# Patient Record
Sex: Female | Born: 1965 | Race: Black or African American | Hispanic: No | Marital: Single | Smoking: Never smoker
Health system: Southern US, Community
[De-identification: ages and names within clinical notes are randomized; demographics above are authoritative.]

---

## 2001-03-15 ENCOUNTER — Encounter: Payer: Self-pay | Admitting: *Deleted

## 2001-03-15 ENCOUNTER — Ambulatory Visit (HOSPITAL_COMMUNITY): Admission: RE | Admit: 2001-03-15 | Discharge: 2001-03-15 | Payer: Self-pay | Admitting: *Deleted

## 2001-11-22 ENCOUNTER — Encounter: Payer: Self-pay | Admitting: *Deleted

## 2001-11-22 ENCOUNTER — Encounter: Admission: RE | Admit: 2001-11-22 | Discharge: 2001-11-22 | Payer: Self-pay | Admitting: *Deleted

## 2002-03-03 ENCOUNTER — Other Ambulatory Visit: Admission: RE | Admit: 2002-03-03 | Discharge: 2002-03-03 | Payer: Self-pay | Admitting: Obstetrics and Gynecology

## 2002-03-11 ENCOUNTER — Encounter (INDEPENDENT_AMBULATORY_CARE_PROVIDER_SITE_OTHER): Payer: Self-pay | Admitting: *Deleted

## 2002-03-11 ENCOUNTER — Observation Stay (HOSPITAL_COMMUNITY): Admission: RE | Admit: 2002-03-11 | Discharge: 2002-03-12 | Payer: Self-pay | Admitting: Obstetrics and Gynecology

## 2002-11-07 ENCOUNTER — Encounter: Payer: Self-pay | Admitting: Family Medicine

## 2002-11-07 ENCOUNTER — Ambulatory Visit (HOSPITAL_COMMUNITY): Admission: RE | Admit: 2002-11-07 | Discharge: 2002-11-07 | Payer: Self-pay | Admitting: Family Medicine

## 2003-01-15 ENCOUNTER — Other Ambulatory Visit: Admission: RE | Admit: 2003-01-15 | Discharge: 2003-01-15 | Payer: Self-pay | Admitting: Obstetrics and Gynecology

## 2003-04-18 ENCOUNTER — Emergency Department (HOSPITAL_COMMUNITY): Admission: EM | Admit: 2003-04-18 | Discharge: 2003-04-18 | Payer: Self-pay | Admitting: *Deleted

## 2005-08-23 ENCOUNTER — Other Ambulatory Visit: Admission: RE | Admit: 2005-08-23 | Discharge: 2005-08-23 | Payer: Self-pay | Admitting: Obstetrics and Gynecology

## 2005-09-15 ENCOUNTER — Encounter: Admission: RE | Admit: 2005-09-15 | Discharge: 2005-09-15 | Payer: Self-pay | Admitting: Obstetrics and Gynecology

## 2006-08-03 ENCOUNTER — Other Ambulatory Visit: Admission: RE | Admit: 2006-08-03 | Discharge: 2006-08-03 | Payer: Self-pay | Admitting: Obstetrics and Gynecology

## 2006-12-28 ENCOUNTER — Encounter: Admission: RE | Admit: 2006-12-28 | Discharge: 2006-12-28 | Payer: Self-pay | Admitting: Family Medicine

## 2007-05-17 ENCOUNTER — Other Ambulatory Visit: Admission: RE | Admit: 2007-05-17 | Discharge: 2007-05-17 | Payer: Self-pay | Admitting: Obstetrics and Gynecology

## 2007-11-13 ENCOUNTER — Other Ambulatory Visit: Admission: RE | Admit: 2007-11-13 | Discharge: 2007-11-13 | Payer: Self-pay | Admitting: Obstetrics and Gynecology

## 2008-02-07 ENCOUNTER — Encounter: Admission: RE | Admit: 2008-02-07 | Discharge: 2008-02-07 | Payer: Self-pay | Admitting: Family Medicine

## 2009-08-25 ENCOUNTER — Other Ambulatory Visit: Admission: RE | Admit: 2009-08-25 | Discharge: 2009-08-25 | Payer: Self-pay | Admitting: Obstetrics and Gynecology

## 2010-02-14 ENCOUNTER — Encounter
Admission: RE | Admit: 2010-02-14 | Discharge: 2010-02-14 | Payer: Self-pay | Source: Home / Self Care | Attending: Family Medicine | Admitting: Family Medicine

## 2010-07-22 NOTE — H&P (Signed)
NAME:  [name], [name]                        ACCOUNT NO.:  [id number]   MEDICAL RECORD NO.:  [id number]                   PATIENT TYPE:  AMB   LOCATION:  SDC                                  FACILITY:  [facility]   PHYSICIAN:  Janine Limbo, M.D.            DATE OF BIRTH:  1966/02/13   DATE OF ADMISSION:  DATE OF DISCHARGE:                                HISTORY & PHYSICAL   HISTORY OF PRESENT ILLNESS:  The patient is a 45 year old female, para 1-0-0-  1, who presents for vaginal hysterectomy because of dysmenorrhea,  menorrhagia, dyspareunia, and a fibroid uterus.  An ultrasound showed a 9.9  x 7.9 cm uterus with multiple fibroids.  The largest fibroid measured 6.3 x  6.1 cm.  Nonsteroidal antiinflammatory agents have not relieved her  discomfort.  Birth control pills have not been able to relieve her  discomfort.  She is ready to proceed with definitive therapy.  Her most  recent pap smear was in 12/03 and it was within normal limits.  Her  endometrial biopsy was within normal limits.   PAST MEDICAL HISTORY:  1. The patient denies hypertension and diabetes.  2. She had her wisdom teeth removed in 2000.   ALLERGIES:  None known.   SOCIAL HISTORY:  The patient drinks alcohol socially.  She denies cigarette  use and recreational drug use.   OBSTETRICAL HISTORY:  The patient has had one term vaginal delivery.   REVIEW OF SYSTEMS:  Please see history of present illness.   FAMILY HISTORY:  The patient's father has hypertension.  The patient's  grandfather had colon cancer.   PHYSICAL EXAMINATION:  VITAL SIGNS: Weight is 130 pounds.  HEENT: Within normal limits.  CHEST: Clear.  HEART: Regular rate and rhythm.  BREASTS: Without masses.  ABDOMEN: Nontender.  EXTREMITIES: Within normal limits.  NEUROLOGIC: Grossly normal.  PELVIC: External genitalia normal.  The vagina is normal.  The cervix is  nontender.  The uterus is 10-12 weeks size, posterior, irregular, and firm.  Adnexa no masses.  Rectal vaginal examination confirms.   ASSESSMENT:  1. Fibroid uterus.  2. Dysmenorrhea.  3. Menorrhagia.  4. Dyspareunia.    PLAN:  The patient will undergo a vaginal hysterectomy.  She understands the  indications for her procedure and she accepts the risks of, but not limited  to, anesthetic complications, bleeding, infection, and possible damage to  the surrounding organs.                                               Janine Limbo, M.D.    AVS/MEDQ  D:  03/03/2002  T:  03/03/2002  Job:  [id number]   cc:   Tama Headings. Marina Goodell, M.D.  [address]  [place]  [place] [address]  Fax: [phone]

## 2010-07-22 NOTE — Op Note (Signed)
NAME:  [name], [name]                        ACCOUNT NO.:  [id number]   MEDICAL RECORD NO.:  [id number]                   PATIENT TYPE:  OBV   LOCATION:  9399                                 [facility]ACILITY:  [facility]   PHYSICIAN:  Janine Limbo, M.D.            DATE OF BIRTH:  13-Aug-1965   DATE OF PROCEDURE:  03/11/2002  DATE OF DISCHARGE:                                 OPERATIVE REPORT   PREOPERATIVE DIAGNOSES:  1. A 12 week sized fibroid uterus.  2. Menometrorrhagia.   POSTOPERATIVE DIAGNOSES:  1. A 14 week sized fibroid uterus.  2. Menometrorrhagia.   PROCEDURE:  Vaginal hysterectomy.   SURGEON:  Janine Limbo, M.D.   FIRST ASSISTANT:  Osborn Coho, M.D.   ANESTHESIA:  General.   DISPOSITION:  The patient is a 45 year old female para 1-0-0-1 who presents  with the above mentioned diagnoses.  She understands the indications for her  procedure and she accepts the risks of, but not limited to, anesthetic  complications, bleeding, infections, and possible damage to the surrounding  organs.   FINDINGS:  A 14 week sized fibroid uterus was present.  The weight of the  uterus was 350 g.  The fallopian tubes and the ovaries were normal.   PROCEDURE:  The patient was taken to the operating room where a general  anesthetic was given.  The patient's lower abdomen, perineum, and vagina  were prepped with multiple layers of Betadine.  A Foley catheter was placed  in the bladder.  Examination under anesthesia was performed.  The patient  was sterilely draped.  A weighted speculum was placed in the posterior  vagina and the cervix was injected with a diluted solution of Pitressin and  saline.  A circumferential incision was made around the cervix.  The vaginal  mucosa was advanced both anteriorly and posteriorly.  The anterior cul-de-  sac and then the posterior cul-de-sacs were sharply entered.  Alternating  from left to right the uterosacral ligaments, paracervical tissues,  parametrial tissues, uterine arteries, and upper pedicles were clamped, cut,  sutured, and tied securely.  The uterus was inverted through the posterior  colpotomy.  The remainder of the upper pedicles were secured and cut.  The  uterus was removed from the operative field.  The upper pedicles were then  free tied and then suture ligated.  Hemostasis was achieved using figure-of-  eight sutures.  The sutures attached to the uterosacral ligaments were  brought out through the vaginal angles and tied securely.  A McCall  culdoplasty suture was placed in the posterior cul-de-sac, incorporating the  uterosacral ligaments bilaterally and the posterior peritoneum.  A final  check was made for hemostasis and hemostasis was noted to be adequate.  A  purse-string suture was placed in the peritoneum and closed securely.  The  vaginal cuff was closed using a running suture.  The McCall culdoplasty  suture was tied securely and  the apex of the vagina was noted to elevate  into the mid pelvis.  0 Vicryl was the suture material used throughout the  procedure.  Sponge, needle, and instrument counts  were correct on two occasions.  The estimated blood loss was 100 cc.  The  patient was noted to drain clear yellow urine at the end of her procedure.  The patient was awakened from her anesthetic and taken to the recovery room  in stable condition.                                               Janine Limbo, M.D.    AVS/MEDQ  D:  03/11/2002  T:  03/11/2002  Job:  [id number]   cc:   Tama Headings. Marina Goodell, M.D.  [address]  [place]  [place] [address]  Fax: [phone]

## 2010-07-22 NOTE — Discharge Summary (Signed)
   NAME:  [name], [name]                        ACCOUNT NO.:  [id number]   MEDICAL RECORD NO.:  [id number]                   PATIENT TYPE:  OBV   LOCATION:  9308                                 [facility]ILITY:  [facility]   PHYSICIAN:  Janine Limbo, M.D.            DATE OF BIRTH:  06-Dec-1965   DATE OF ADMISSION:  03/11/2002  DATE OF DISCHARGE:  03/12/2002                                 DISCHARGE SUMMARY   DISCHARGE DIAGNOSES:  1. Fibroid uterus.  2. Menometrorrhagia.   PROCEDURE:  March 11, 2002, - Hysterectomy.   HISTORY OF PRESENT ILLNESS:  The patient is a 45 year old female, para 1-0-0-  1, who presents with heavy vaginal bleeding and a known history of fibroids.  She was ready to proceed with definitive therapy.  Please see her dictated  history and physical for details.   ADMISSION PHYSICAL EXAMINATION:  The patient was noted to have a 12-week  size fibroid uterus.   HOSPITAL COURSE:  On the day of admission, the patient underwent a vaginal  hysterectomy.  Operative findings included a 14-week size fibroid uterus.  The fallopian tubes and ovaries were normal.  The patient tolerated the  procedure well.  Her postoperative course was uneventful.  Her postoperative  hemoglobin was 10.2 (preoperative hemoglobin 12.4).  She quickly tolerated a  regular diet.  On postoperative day #1 she was tolerating a regular diet and  was ready for discharge.   DISCHARGE INSTRUCTIONS:  The patient will return to Dr. Stefano Gaul in six  weeks for follow-up examination.  She will refrain from driving for one to  two weeks, heavy lifting for four weeks, and intercourse for six weeks.  She  will call for questions or concerns.   DISCHARGE MEDICATIONS:  1. Vicodin one to two p.o. q.4h. p.r.n. for pain.  2. Iron 325 mg each day.   PATHOLOGY:  The final pathology report is pending.                                               Janine Limbo, M.D.    AVS/MEDQ  D:  03/12/2002  T:  03/12/2002   Job:  [id number]

## 2012-03-19 ENCOUNTER — Other Ambulatory Visit: Payer: Self-pay | Admitting: Family Medicine

## 2012-03-19 DIAGNOSIS — Z1231 Encounter for screening mammogram for malignant neoplasm of breast: Secondary | ICD-10-CM

## 2012-06-25 ENCOUNTER — Ambulatory Visit
Admission: RE | Admit: 2012-06-25 | Discharge: 2012-06-25 | Disposition: A | Discharge status: Home / Self Care | Payer: BC Managed Care – PPO | Source: Ambulatory Visit | Attending: Family Medicine | Admitting: Family Medicine

## 2012-06-25 DIAGNOSIS — Z1231 Encounter for screening mammogram for malignant neoplasm of breast: Secondary | ICD-10-CM

## 2012-07-03 ENCOUNTER — Other Ambulatory Visit: Payer: Self-pay | Admitting: Family Medicine

## 2012-07-03 ENCOUNTER — Ambulatory Visit
Admission: RE | Admit: 2012-07-03 | Discharge: 2012-07-03 | Disposition: A | Discharge status: Home / Self Care | Payer: No Typology Code available for payment source | Source: Ambulatory Visit | Attending: Family Medicine | Admitting: Family Medicine

## 2012-07-03 DIAGNOSIS — M79609 Pain in unspecified limb: Secondary | ICD-10-CM

## 2013-06-02 ENCOUNTER — Other Ambulatory Visit: Payer: Self-pay

## 2013-06-02 DIAGNOSIS — Z1231 Encounter for screening mammogram for malignant neoplasm of breast: Secondary | ICD-10-CM

## 2013-07-16 ENCOUNTER — Ambulatory Visit
Admission: RE | Admit: 2013-07-16 | Discharge: 2013-07-16 | Disposition: A | Discharge status: Home / Self Care | Payer: BC Managed Care – PPO | Source: Ambulatory Visit

## 2013-07-16 ENCOUNTER — Encounter (INDEPENDENT_AMBULATORY_CARE_PROVIDER_SITE_OTHER): Payer: Self-pay

## 2013-07-16 DIAGNOSIS — Z1231 Encounter for screening mammogram for malignant neoplasm of breast: Secondary | ICD-10-CM

## 2014-06-18 ENCOUNTER — Other Ambulatory Visit: Payer: Self-pay

## 2014-06-18 DIAGNOSIS — Z1231 Encounter for screening mammogram for malignant neoplasm of breast: Secondary | ICD-10-CM

## 2014-08-05 ENCOUNTER — Ambulatory Visit
Admission: RE | Admit: 2014-08-05 | Discharge: 2014-08-05 | Disposition: A | Discharge status: Home / Self Care | Payer: BLUE CROSS/BLUE SHIELD | Source: Ambulatory Visit

## 2014-08-05 DIAGNOSIS — Z1231 Encounter for screening mammogram for malignant neoplasm of breast: Secondary | ICD-10-CM

## 2014-11-13 ENCOUNTER — Other Ambulatory Visit: Payer: Self-pay | Admitting: Gastroenterology

## 2015-02-17 ENCOUNTER — Other Ambulatory Visit: Payer: Self-pay | Admitting: Physician Assistant

## 2015-02-17 DIAGNOSIS — R1032 Left lower quadrant pain: Secondary | ICD-10-CM

## 2015-03-03 ENCOUNTER — Ambulatory Visit
Admission: RE | Admit: 2015-03-03 | Discharge: 2015-03-03 | Disposition: A | Discharge status: Home / Self Care | Payer: BLUE CROSS/BLUE SHIELD | Source: Ambulatory Visit | Attending: Physician Assistant | Admitting: Physician Assistant

## 2015-03-03 DIAGNOSIS — R1032 Left lower quadrant pain: Secondary | ICD-10-CM

## 2015-06-30 ENCOUNTER — Other Ambulatory Visit: Payer: Self-pay

## 2015-06-30 DIAGNOSIS — Z1231 Encounter for screening mammogram for malignant neoplasm of breast: Secondary | ICD-10-CM

## 2015-08-12 ENCOUNTER — Ambulatory Visit
Admission: RE | Admit: 2015-08-12 | Discharge: 2015-08-12 | Disposition: A | Discharge status: Home / Self Care | Payer: BLUE CROSS/BLUE SHIELD | Source: Ambulatory Visit

## 2015-08-12 ENCOUNTER — Other Ambulatory Visit: Payer: Self-pay | Admitting: Family Medicine

## 2015-08-12 DIAGNOSIS — Z1231 Encounter for screening mammogram for malignant neoplasm of breast: Secondary | ICD-10-CM

## 2015-08-17 ENCOUNTER — Other Ambulatory Visit: Payer: Self-pay | Admitting: Family Medicine

## 2015-08-17 DIAGNOSIS — R928 Other abnormal and inconclusive findings on diagnostic imaging of breast: Secondary | ICD-10-CM

## 2015-08-24 ENCOUNTER — Ambulatory Visit
Admission: RE | Admit: 2015-08-24 | Discharge: 2015-08-24 | Disposition: A | Discharge status: Home / Self Care | Payer: BLUE CROSS/BLUE SHIELD | Source: Ambulatory Visit | Attending: Family Medicine | Admitting: Family Medicine

## 2015-08-24 DIAGNOSIS — R928 Other abnormal and inconclusive findings on diagnostic imaging of breast: Secondary | ICD-10-CM

## 2016-03-15 ENCOUNTER — Other Ambulatory Visit: Payer: Self-pay | Admitting: Family Medicine

## 2016-03-15 ENCOUNTER — Ambulatory Visit
Admission: RE | Admit: 2016-03-15 | Discharge: 2016-03-15 | Disposition: A | Discharge status: Home / Self Care | Payer: PRIVATE HEALTH INSURANCE | Source: Ambulatory Visit | Attending: Family Medicine | Admitting: Family Medicine

## 2016-03-15 DIAGNOSIS — M25522 Pain in left elbow: Secondary | ICD-10-CM

## 2016-07-14 ENCOUNTER — Other Ambulatory Visit: Payer: Self-pay | Admitting: Family Medicine

## 2016-07-14 DIAGNOSIS — Z1231 Encounter for screening mammogram for malignant neoplasm of breast: Secondary | ICD-10-CM

## 2016-08-25 ENCOUNTER — Ambulatory Visit
Admission: RE | Admit: 2016-08-25 | Discharge: 2016-08-25 | Disposition: A | Discharge status: Home / Self Care | Payer: No Typology Code available for payment source | Source: Ambulatory Visit | Attending: Family Medicine | Admitting: Family Medicine

## 2016-08-25 DIAGNOSIS — Z1231 Encounter for screening mammogram for malignant neoplasm of breast: Secondary | ICD-10-CM

## 2016-11-22 ENCOUNTER — Ambulatory Visit (INDEPENDENT_AMBULATORY_CARE_PROVIDER_SITE_OTHER): Payer: No Typology Code available for payment source

## 2016-11-22 ENCOUNTER — Encounter (INDEPENDENT_AMBULATORY_CARE_PROVIDER_SITE_OTHER): Payer: Self-pay | Admitting: Orthopedic Surgery

## 2016-11-22 ENCOUNTER — Ambulatory Visit (INDEPENDENT_AMBULATORY_CARE_PROVIDER_SITE_OTHER): Payer: No Typology Code available for payment source | Admitting: Orthopedic Surgery

## 2016-11-22 DIAGNOSIS — M25512 Pain in left shoulder: Secondary | ICD-10-CM

## 2016-11-22 DIAGNOSIS — G8929 Other chronic pain: Secondary | ICD-10-CM

## 2016-11-22 NOTE — Progress Notes (Signed)
Office Visit Note   Patient: [name]           Date of Birth: March 20, 1965           MRN: [id number] Visit Date: 11/22/2016 Requested by: Merri Brunette, MD [phone] [place], [place] [address] PCP: Merri Brunette, MD  Subjective: Chief Complaint  Patient presents with  . 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  Denies any history of injury.  Pain is been going on for 6 months.  Radiates to the elbow but not below the elbow.  She denies any neck pain.  She likes to workout about 5 days a week.  It is hard for her to get undressed at this time.  She denies any mechanical symptoms but just reports pain primarily with abduction.  She also has our time reaching across her body to the other shoulder.  She cannot lay on that side.  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  Her workout routine specifically for the shoulder has necessarily been curtailed because of pain.  She does have a remote history of GI bleed taking anti-inflammatories.              ROS: All systems reviewed are negative as they relate to the chief complaint within the history of present illness.  Patient denies  fevers or chills.   Assessment & Plan: Visit Diagnoses:  1. Chronic left shoulder pain     Plan: Impression is atypical left shoulder pain which today looks like may be adhesive capsulitis which the patient has been working through that she has been working out.  Not much in the way of rotator cuff weakness or labral pathology.  Again pain with history of no trauma in a 51 year old female is some restriction of passive range of motion is most likely adhesive capsulitis.  I will try her on to axis only one per day as an anti-inflammatory and because of duration of symptoms and the history and examination which is mostly but not entirely consistent with frozen shoulder I would like to get an MRI arthrogram of the shoulder.   I will see her back after that study  Follow-Up Instructions: Return for after MRI.   Orders:  Orders Placed This Encounter  Procedures  . XR Shoulder Left  . MR Shoulder Left w/ contrast  . Arthrogram   No orders of the defined types were placed in this encounter.     Procedures: No procedures performed   Clinical Data: No additional findings.  Objective: Vital Signs: There were no vitals taken for this visit.  Physical Exam:   Constitutional: Patient appears well-developed HEENT:  Head: Normocephalic Eyes:EOM are normal Neck: Normal range of motion Cardiovascular: Normal rate Pulmonary/chest: Effort normal Neurologic: Patient is alert Skin: Skin is warm Psychiatric: Patient has normal mood and affect    Ortho Exam: Orthopedic exam demonstrates good cervical spine range of motion.  5 out of 5 grip EPL FPL interosseous wrist flexion-extension biceps triceps and deltoid strength.  Radial pulses intact.  She does have a little limitation with crossarm adduction but no acromioclavicular joint tenderness on the left-hand side.  Rotator cuff strength feels intact.  External rotation range of motion is not limited but is painful at the end range.  She also lacks about 10 of full forward flexion of the left compared to the right.  No other  masses lymph adenopathy or skin changes noted in the shoulder girdle region  Specialty Comments:  No specialty comments available.  Imaging: Xr Shoulder Left  Result Date: 11/22/2016 AP lateral elbow and left shoulder reviewed.  Shoulder is located.  Visualized lung fields clear.  Only mild degenerative acromioclavicular joint narrowing.  No acromial spurring.  Acromiohumeral distance maintained.  Normal shoulder    PMFS History: There are no active problems to display for this patient.  No past medical history on file.  No family history on file.  No past surgical history on file. Social History   Occupational History  . Not  on file.   Social History Main Topics  . Smoking status: Never Smoker  . Smokeless tobacco: Never Used  . Alcohol use Not on file  . Drug use: Unknown  . Sexual activity: Not on file

## 2016-12-08 ENCOUNTER — Telehealth (INDEPENDENT_AMBULATORY_CARE_PROVIDER_SITE_OTHER): Payer: Self-pay | Admitting: Orthopedic Surgery

## 2016-12-08 NOTE — Telephone Encounter (Signed)
11/22/2016 OV NOTE FAXED [phone] [facility] @ [facility]

## 2016-12-13 ENCOUNTER — Other Ambulatory Visit: Payer: No Typology Code available for payment source

## 2016-12-20 ENCOUNTER — Ambulatory Visit (INDEPENDENT_AMBULATORY_CARE_PROVIDER_SITE_OTHER): Payer: No Typology Code available for payment source | Admitting: Orthopedic Surgery

## 2017-07-27 ENCOUNTER — Other Ambulatory Visit: Payer: Self-pay | Admitting: Family Medicine

## 2017-07-27 DIAGNOSIS — Z1231 Encounter for screening mammogram for malignant neoplasm of breast: Secondary | ICD-10-CM

## 2017-08-31 ENCOUNTER — Ambulatory Visit
Admission: RE | Admit: 2017-08-31 | Discharge: 2017-08-31 | Disposition: A | Discharge status: Home / Self Care | Payer: PRIVATE HEALTH INSURANCE | Source: Ambulatory Visit | Attending: Family Medicine | Admitting: Family Medicine

## 2017-08-31 DIAGNOSIS — Z1231 Encounter for screening mammogram for malignant neoplasm of breast: Secondary | ICD-10-CM

## 2018-01-02 ENCOUNTER — Other Ambulatory Visit: Payer: Self-pay | Admitting: Family Medicine

## 2018-01-02 ENCOUNTER — Ambulatory Visit
Admission: RE | Admit: 2018-01-02 | Discharge: 2018-01-02 | Disposition: A | Discharge status: Home / Self Care | Payer: PRIVATE HEALTH INSURANCE | Source: Ambulatory Visit | Attending: Family Medicine | Admitting: Family Medicine

## 2018-01-02 DIAGNOSIS — M545 Low back pain, unspecified: Secondary | ICD-10-CM

## 2018-07-23 ENCOUNTER — Other Ambulatory Visit: Payer: Self-pay | Admitting: Family Medicine

## 2018-07-23 DIAGNOSIS — Z1231 Encounter for screening mammogram for malignant neoplasm of breast: Secondary | ICD-10-CM

## 2018-08-02 ENCOUNTER — Other Ambulatory Visit: Payer: Self-pay

## 2018-08-02 ENCOUNTER — Ambulatory Visit
Admission: RE | Admit: 2018-08-02 | Discharge: 2018-08-02 | Disposition: A | Discharge status: Home / Self Care | Payer: PRIVATE HEALTH INSURANCE | Source: Ambulatory Visit | Attending: Family Medicine | Admitting: Family Medicine

## 2018-08-02 ENCOUNTER — Other Ambulatory Visit: Payer: Self-pay | Admitting: Family Medicine

## 2018-08-02 DIAGNOSIS — S6992XD Unspecified injury of left wrist, hand and finger(s), subsequent encounter: Secondary | ICD-10-CM

## 2018-09-11 ENCOUNTER — Other Ambulatory Visit: Payer: Self-pay

## 2018-09-11 ENCOUNTER — Ambulatory Visit
Admission: RE | Admit: 2018-09-11 | Discharge: 2018-09-11 | Disposition: A | Discharge status: Home / Self Care | Payer: PRIVATE HEALTH INSURANCE | Source: Ambulatory Visit | Attending: Family Medicine | Admitting: Family Medicine

## 2018-09-11 DIAGNOSIS — Z1231 Encounter for screening mammogram for malignant neoplasm of breast: Secondary | ICD-10-CM

## 2019-10-17 ENCOUNTER — Other Ambulatory Visit: Payer: Self-pay | Admitting: Family Medicine

## 2019-10-17 DIAGNOSIS — Z1231 Encounter for screening mammogram for malignant neoplasm of breast: Secondary | ICD-10-CM

## 2019-11-05 ENCOUNTER — Other Ambulatory Visit: Payer: Self-pay

## 2019-11-05 ENCOUNTER — Ambulatory Visit
Admission: RE | Admit: 2019-11-05 | Discharge: 2019-11-05 | Disposition: A | Discharge status: Home / Self Care | Payer: PRIVATE HEALTH INSURANCE | Source: Ambulatory Visit | Attending: Family Medicine | Admitting: Family Medicine

## 2019-11-05 DIAGNOSIS — Z1231 Encounter for screening mammogram for malignant neoplasm of breast: Secondary | ICD-10-CM

## 2019-12-16 ENCOUNTER — Other Ambulatory Visit: Payer: Self-pay | Admitting: Family Medicine

## 2019-12-16 DIAGNOSIS — R519 Headache, unspecified: Secondary | ICD-10-CM

## 2019-12-19 ENCOUNTER — Ambulatory Visit
Admission: RE | Admit: 2019-12-19 | Discharge: 2019-12-19 | Disposition: A | Discharge status: Home / Self Care | Payer: PRIVATE HEALTH INSURANCE | Source: Ambulatory Visit | Attending: Family Medicine | Admitting: Family Medicine

## 2019-12-19 ENCOUNTER — Other Ambulatory Visit: Payer: Self-pay

## 2019-12-19 DIAGNOSIS — R519 Headache, unspecified: Secondary | ICD-10-CM

## 2019-12-19 MED ORDER — GADOBENATE DIMEGLUMINE 529 MG/ML IV SOLN
14.0000 mL | Freq: Once | INTRAVENOUS | Status: AC | PRN
Start: 2019-12-19 — End: 2019-12-19
  Administered 2019-12-19: 14 mL via INTRAVENOUS

## 2020-01-06 ENCOUNTER — Other Ambulatory Visit: Payer: PRIVATE HEALTH INSURANCE

## 2020-01-08 ENCOUNTER — Other Ambulatory Visit: Payer: PRIVATE HEALTH INSURANCE

## 2020-09-27 ENCOUNTER — Other Ambulatory Visit: Payer: Self-pay | Admitting: Radiology

## 2020-09-27 ENCOUNTER — Other Ambulatory Visit: Payer: Self-pay | Admitting: Family Medicine

## 2020-09-27 DIAGNOSIS — Z1231 Encounter for screening mammogram for malignant neoplasm of breast: Secondary | ICD-10-CM

## 2020-11-17 ENCOUNTER — Ambulatory Visit
Admission: RE | Admit: 2020-11-17 | Discharge: 2020-11-17 | Disposition: A | Discharge status: Home / Self Care | Payer: No Typology Code available for payment source | Source: Ambulatory Visit | Attending: Family Medicine | Admitting: Family Medicine

## 2020-11-17 ENCOUNTER — Other Ambulatory Visit: Payer: Self-pay

## 2020-11-17 DIAGNOSIS — Z1231 Encounter for screening mammogram for malignant neoplasm of breast: Secondary | ICD-10-CM

## 2021-05-12 ENCOUNTER — Ambulatory Visit
Admission: RE | Admit: 2021-05-12 | Discharge: 2021-05-12 | Disposition: A | Discharge status: Home / Self Care | Payer: No Typology Code available for payment source | Source: Ambulatory Visit | Attending: Family Medicine | Admitting: Family Medicine

## 2021-05-12 ENCOUNTER — Other Ambulatory Visit: Payer: Self-pay | Admitting: Family Medicine

## 2021-05-12 ENCOUNTER — Other Ambulatory Visit: Payer: Self-pay

## 2021-05-12 DIAGNOSIS — M545 Low back pain, unspecified: Secondary | ICD-10-CM

## 2021-05-12 DIAGNOSIS — M546 Pain in thoracic spine: Secondary | ICD-10-CM

## 2021-10-21 ENCOUNTER — Other Ambulatory Visit: Payer: Self-pay | Admitting: Family Medicine

## 2021-10-21 DIAGNOSIS — Z1231 Encounter for screening mammogram for malignant neoplasm of breast: Secondary | ICD-10-CM

## 2021-11-02 IMAGING — MR MR HEAD WO/W CM
12 series · 48 of 48 positions shown · IV contrast (multihance)
Comparison: None.

CLINICAL DATA: Headaches

EXAM:
MRI HEAD WITHOUT AND WITH CONTRAST
TECHNIQUE: Multiplanar, multiecho pulse sequences of the brain and surrounding
structures were obtained without and with intravenous contrast.
CONTRAST:  14mL MULTIHANCE GADOBENATE DIMEGLUMINE 529 MG/ML IV SOLN

[Series 1: t1_se_sag · sagittal · 5.0mm · 0.45mm/px · 1 of 26 slices shown]
[im 1/26]
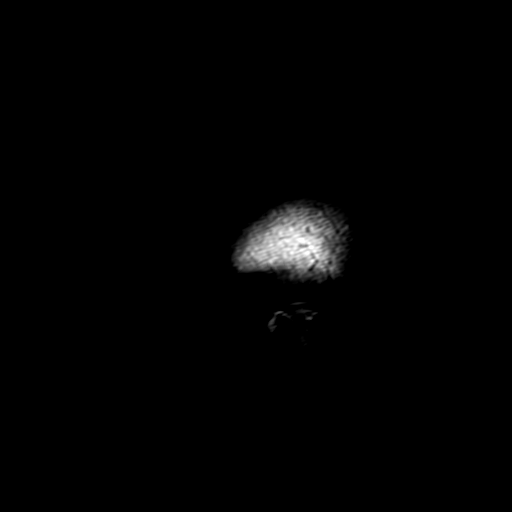

[Series 2: ep2d_diff_3 · axial · 3.0mm · 1.80mm/px · z∈[-67,+80]mm · 6 of 100 slices shown]
[im 1/100]
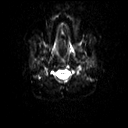
[im 20/100]
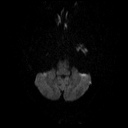
[im 40/100]
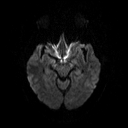
[im 60/100]
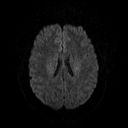
[im 80/100]
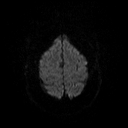
[im 100/100]
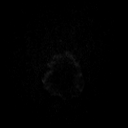

[Series 3: ep2d_diff_3_adc · axial · 3.0mm · 1.80mm/px · z∈[-67,+80]mm · 3 of 50 slices shown]
[im 1/50]
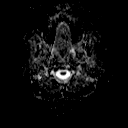
[im 25/50]
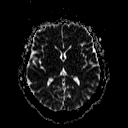
[im 50/50]
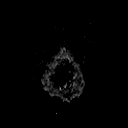

[Series 4: ep2d_diff_cor · coronal · 5.0mm · 1.77mm/px · 3 of 52 slices shown]
[im 1/52]
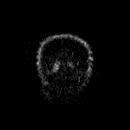
[im 26/52]
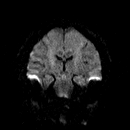
[im 52/52]
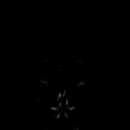

[Series 5: ep2d_diff_cor_adc · coronal · 5.0mm · 1.77mm/px · 2 of 27 slices shown]
[im 1/27]
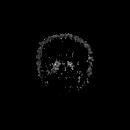
[im 27/27]
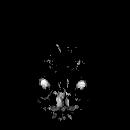

[Series 7: swi_images · axial · 2.0mm · 0.90mm/px · z∈[-68,+74]mm · 5 of 72 slices shown]
[im 1/72]
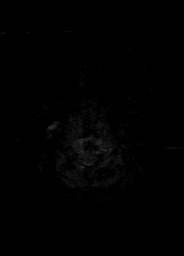
[im 18/72]
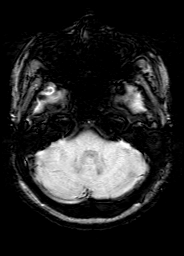
[im 36/72]
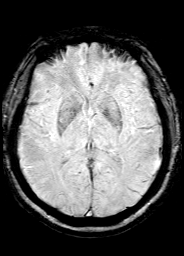
[im 54/72]
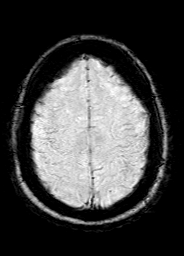
[im 72/72]
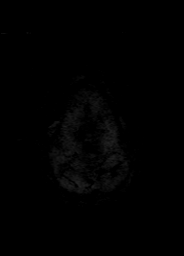

[Series 8: FLAIR · axial · 3.0mm · 0.43mm/px · z∈[-75,+81]mm · 2 of 27 slices shown]
[im 1/27]
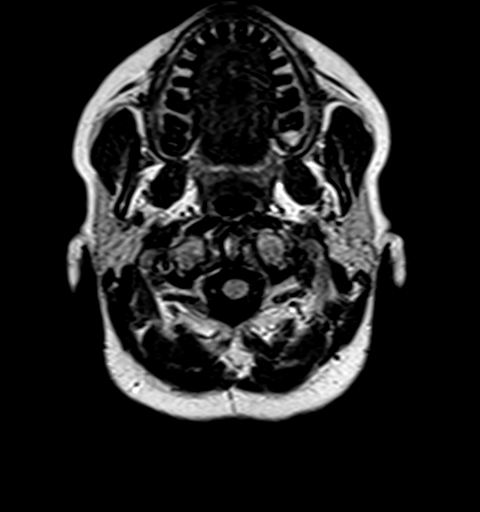
[im 27/27]
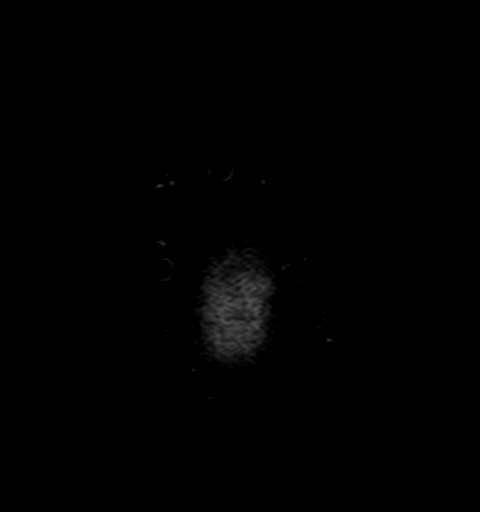

[Series 9: t2_tse_tra_512 · axial · 5.0mm · 0.60mm/px · z∈[-72,+78]mm · 2 of 26 slices shown]
[im 1/26]
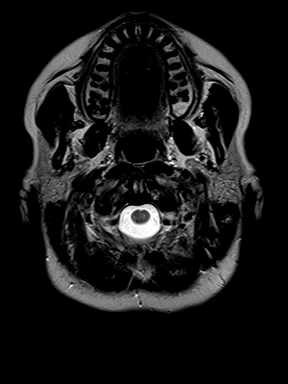
[im 26/26]
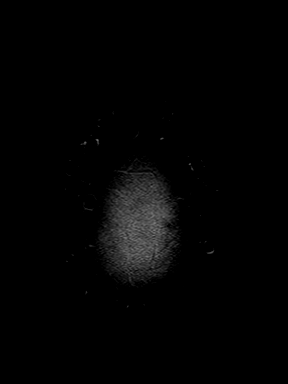

[Series 10: t1_mpr_tra · axial · 1.0mm · 0.72mm/px · z∈[-76,+83]mm · 10 of 160 slices shown]
[im 1/160]
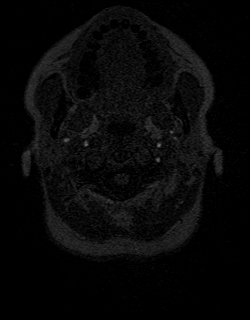
[im 18/160]
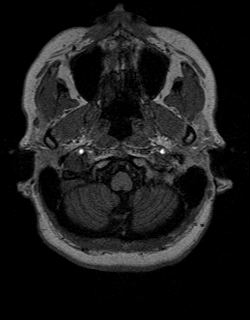
[im 36/160]
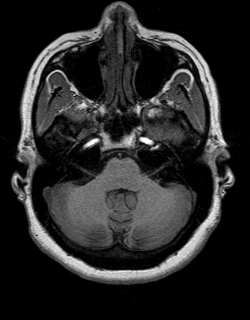
[im 54/160]
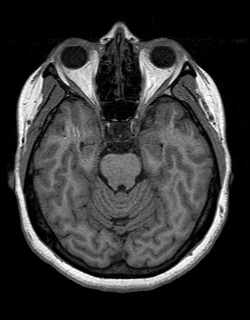
[im 71/160]
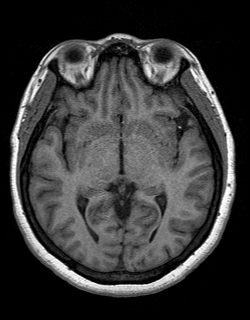
[im 89/160]
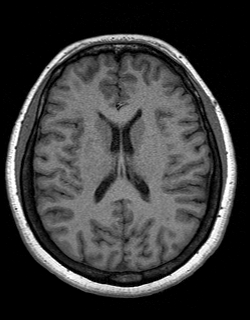
[im 107/160]
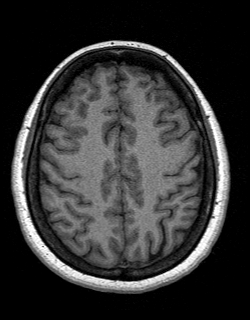
[im 124/160]
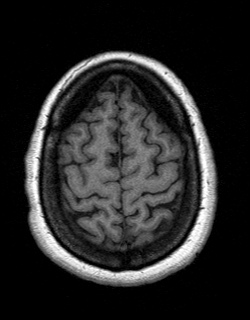
[im 142/160]
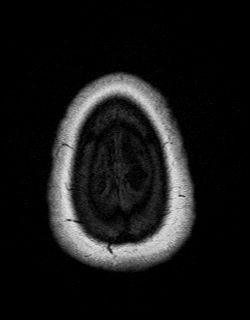
[im 160/160]
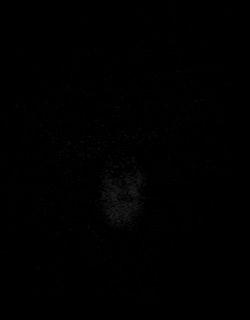

[Series 11: T2 · coronal · 5.0mm · 0.45mm/px · 2 of 30 slices shown]
[im 1/30]
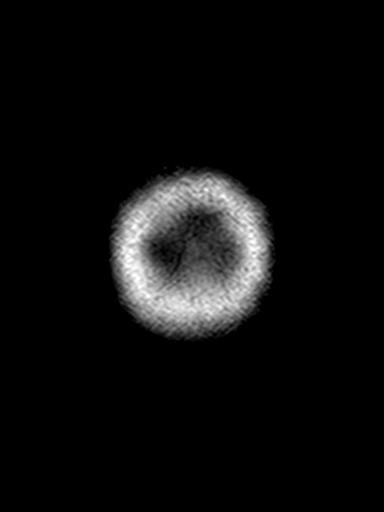
[im 30/30]
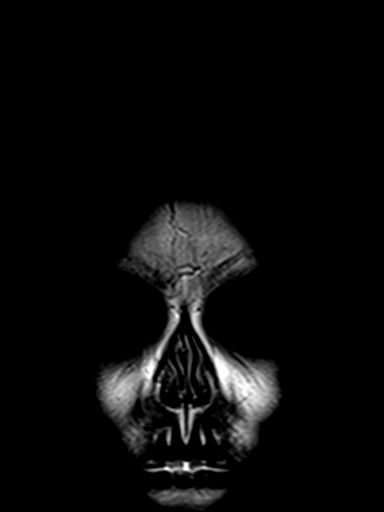

[Series 12: T1 post-contrast · coronal · 5.0mm · 0.72mm/px · 2 of 30 slices shown]
[im 1/30]
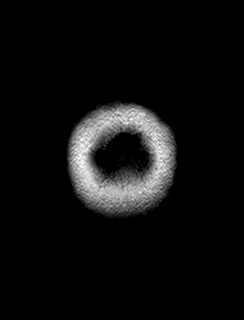
[im 30/30]
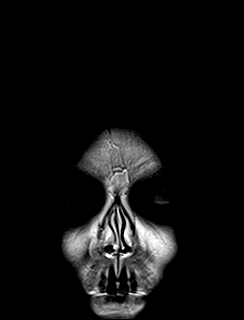

[Series 13: post t1_mpr_tra · axial · 1.0mm · 0.72mm/px · z∈[-76,+83]mm · 10 of 160 slices shown]
[im 1/160]
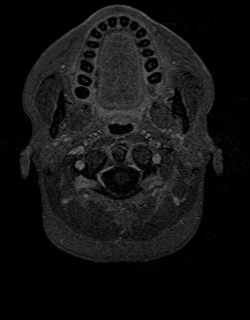
[im 18/160]
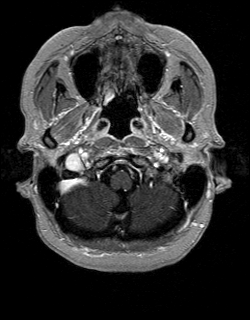
[im 36/160]
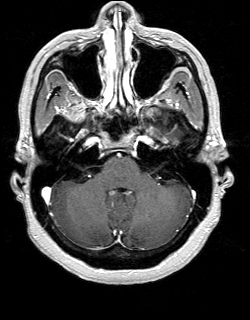
[im 54/160]
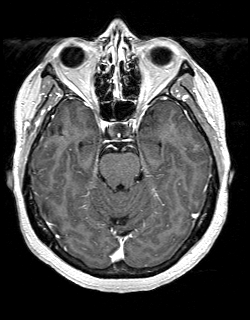
[im 71/160]
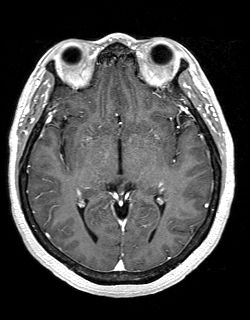
[im 89/160]
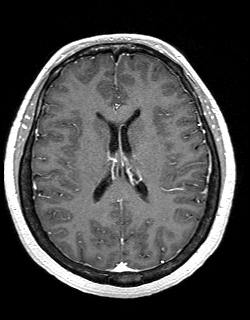
[im 107/160]
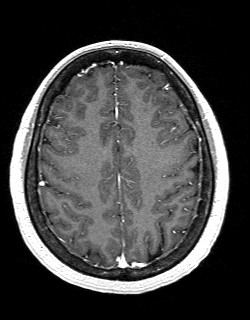
[im 124/160]
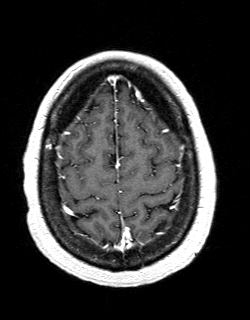
[im 142/160]
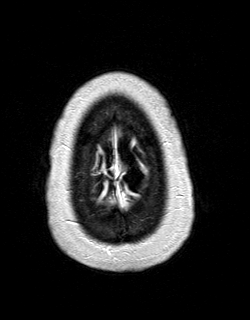
[im 160/160]
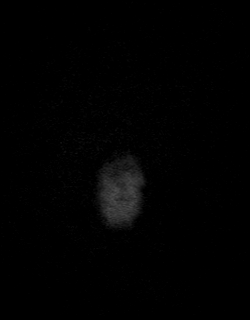

[48 of 48 positions shown; findings below may reference images not displayed]

FINDINGS: Brain: There is no acute infarction or intracranial hemorrhage.
There is no intracranial mass, mass effect, or edema. There is no
hydrocephalus or extra-axial fluid collection. Ventricles and sulci
are normal in size and configuration. Minimal punctate foci of T2
hyperintensity in the supratentorial white matter likely reflect
nonspecific gliosis/demyelination of doubtful clinical significance.
No abnormal enhancement.

Vascular: Major vessel flow voids at the skull base are preserved.

Skull and upper cervical spine: Normal marrow signal is preserved.

Sinuses/Orbits: Trace mucosal thickening.  Orbits are unremarkable.

Other: Sella is unremarkable.  Mastoid air cells are clear.
IMPRESSION: No intracranial mass, abnormal enhancement, or other significant
abnormality.

## 2021-11-21 ENCOUNTER — Ambulatory Visit
Admission: RE | Admit: 2021-11-21 | Discharge: 2021-11-21 | Disposition: A | Discharge status: Home / Self Care | Payer: No Typology Code available for payment source | Source: Ambulatory Visit | Attending: Family Medicine | Admitting: Family Medicine

## 2021-11-21 DIAGNOSIS — Z1231 Encounter for screening mammogram for malignant neoplasm of breast: Secondary | ICD-10-CM

## 2021-11-23 ENCOUNTER — Other Ambulatory Visit: Payer: Self-pay | Admitting: Family Medicine

## 2021-11-23 DIAGNOSIS — R928 Other abnormal and inconclusive findings on diagnostic imaging of breast: Secondary | ICD-10-CM

## 2021-12-01 ENCOUNTER — Other Ambulatory Visit: Payer: Self-pay | Admitting: Family Medicine

## 2021-12-01 ENCOUNTER — Ambulatory Visit
Admission: RE | Admit: 2021-12-01 | Discharge: 2021-12-01 | Disposition: A | Discharge status: Home / Self Care | Payer: No Typology Code available for payment source | Source: Ambulatory Visit | Attending: Family Medicine | Admitting: Family Medicine

## 2021-12-01 DIAGNOSIS — R928 Other abnormal and inconclusive findings on diagnostic imaging of breast: Secondary | ICD-10-CM

## 2021-12-01 DIAGNOSIS — N6489 Other specified disorders of breast: Secondary | ICD-10-CM

## 2021-12-16 ENCOUNTER — Ambulatory Visit
Admission: RE | Admit: 2021-12-16 | Discharge: 2021-12-16 | Disposition: A | Discharge status: Home / Self Care | Payer: No Typology Code available for payment source | Source: Ambulatory Visit | Attending: Family Medicine | Admitting: Family Medicine

## 2021-12-16 DIAGNOSIS — N6489 Other specified disorders of breast: Secondary | ICD-10-CM

## 2022-01-03 ENCOUNTER — Other Ambulatory Visit: Payer: Self-pay | Admitting: Family Medicine

## 2022-01-03 DIAGNOSIS — N6489 Other specified disorders of breast: Secondary | ICD-10-CM

## 2022-06-19 ENCOUNTER — Ambulatory Visit
Admission: RE | Admit: 2022-06-19 | Discharge: 2022-06-19 | Disposition: A | Discharge status: Home / Self Care | Payer: No Typology Code available for payment source | Source: Ambulatory Visit | Attending: Family Medicine | Admitting: Family Medicine

## 2022-06-19 ENCOUNTER — Other Ambulatory Visit: Payer: Self-pay | Admitting: Family Medicine

## 2022-06-19 DIAGNOSIS — N6489 Other specified disorders of breast: Secondary | ICD-10-CM

## 2022-12-06 ENCOUNTER — Ambulatory Visit
Admission: RE | Admit: 2022-12-06 | Discharge: 2022-12-06 | Disposition: A | Discharge status: Home / Self Care | Payer: No Typology Code available for payment source | Source: Ambulatory Visit | Attending: Family Medicine | Admitting: Family Medicine

## 2022-12-06 ENCOUNTER — Ambulatory Visit: Admission: RE | Admit: 2022-12-06 | Payer: No Typology Code available for payment source | Source: Ambulatory Visit

## 2022-12-06 DIAGNOSIS — N6489 Other specified disorders of breast: Secondary | ICD-10-CM

## 2024-02-12 ENCOUNTER — Other Ambulatory Visit: Payer: Self-pay | Admitting: Family Medicine

## 2024-02-12 DIAGNOSIS — N6489 Other specified disorders of breast: Secondary | ICD-10-CM

## 2024-04-11 ENCOUNTER — Ambulatory Visit: Admission: RE | Admit: 2024-04-11 | Source: Ambulatory Visit

## 2024-04-11 DIAGNOSIS — N6489 Other specified disorders of breast: Secondary | ICD-10-CM
